# Patient Record
Sex: Female | Born: 1980 | Race: Black or African American | Hispanic: No | Marital: Single | Smoking: Never smoker
Health system: Southern US, Community
[De-identification: ages and names within clinical notes are randomized; demographics above are authoritative.]

## PROBLEM LIST (undated history)

## (undated) DIAGNOSIS — I10 Essential (primary) hypertension: Secondary | ICD-10-CM

## (undated) HISTORY — PX: TONSILLECTOMY: SUR1361

## (undated) HISTORY — PX: WISDOM TOOTH EXTRACTION: SHX21

---

## 2012-07-27 ENCOUNTER — Emergency Department (HOSPITAL_BASED_OUTPATIENT_CLINIC_OR_DEPARTMENT_OTHER): Payer: No Typology Code available for payment source

## 2012-07-27 ENCOUNTER — Emergency Department (HOSPITAL_BASED_OUTPATIENT_CLINIC_OR_DEPARTMENT_OTHER)
Admission: EM | Admit: 2012-07-27 | Discharge: 2012-07-27 | Disposition: A | Discharge status: Home / Self Care | Payer: No Typology Code available for payment source | Attending: Emergency Medicine | Admitting: Emergency Medicine

## 2012-07-27 ENCOUNTER — Encounter (HOSPITAL_BASED_OUTPATIENT_CLINIC_OR_DEPARTMENT_OTHER): Payer: Self-pay | Admitting: *Deleted

## 2012-07-27 DIAGNOSIS — S20219A Contusion of unspecified front wall of thorax, initial encounter: Secondary | ICD-10-CM | POA: Insufficient documentation

## 2012-07-27 DIAGNOSIS — S93401A Sprain of unspecified ligament of right ankle, initial encounter: Secondary | ICD-10-CM

## 2012-07-27 DIAGNOSIS — S60229A Contusion of unspecified hand, initial encounter: Secondary | ICD-10-CM | POA: Insufficient documentation

## 2012-07-27 DIAGNOSIS — S93409A Sprain of unspecified ligament of unspecified ankle, initial encounter: Secondary | ICD-10-CM | POA: Insufficient documentation

## 2012-07-27 DIAGNOSIS — S60222A Contusion of left hand, initial encounter: Secondary | ICD-10-CM

## 2012-07-27 DIAGNOSIS — Y92410 Unspecified street and highway as the place of occurrence of the external cause: Secondary | ICD-10-CM | POA: Insufficient documentation

## 2012-07-27 LAB — URINALYSIS, ROUTINE W REFLEX MICROSCOPIC
Glucose, UA: NEGATIVE mg/dL
Protein, ur: NEGATIVE mg/dL
pH: 6.5 (ref 5.0–8.0)

## 2012-07-27 LAB — URINE MICROSCOPIC-ADD ON

## 2012-07-27 MED ORDER — IBUPROFEN 800 MG PO TABS: 800.0000 mg | ORAL_TABLET | Freq: Three times a day (TID) | ORAL | Status: AC
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
## 2012-07-28 NOTE — ED Provider Notes (Signed)
Medical screening examination/treatment/procedure(s) were performed by non-physician practitioner and as supervising physician I was immediately available for consultation/collaboration.   Rush Salce W. Fredi Geiler, MD 07/28/12 1251 

## 2013-07-20 ENCOUNTER — Encounter (HOSPITAL_BASED_OUTPATIENT_CLINIC_OR_DEPARTMENT_OTHER): Payer: Self-pay | Admitting: *Deleted

## 2013-07-20 ENCOUNTER — Emergency Department (HOSPITAL_BASED_OUTPATIENT_CLINIC_OR_DEPARTMENT_OTHER)
Admission: EM | Admit: 2013-07-20 | Discharge: 2013-07-20 | Disposition: A | Discharge status: Home / Self Care | Payer: Medicaid Other | Attending: Emergency Medicine | Admitting: Emergency Medicine

## 2013-07-20 DIAGNOSIS — L299 Pruritus, unspecified: Secondary | ICD-10-CM | POA: Insufficient documentation

## 2013-07-20 DIAGNOSIS — R209 Unspecified disturbances of skin sensation: Secondary | ICD-10-CM | POA: Insufficient documentation

## 2013-07-20 DIAGNOSIS — T7840XA Allergy, unspecified, initial encounter: Secondary | ICD-10-CM

## 2013-07-20 DIAGNOSIS — R21 Rash and other nonspecific skin eruption: Secondary | ICD-10-CM | POA: Insufficient documentation

## 2013-07-20 MED ORDER — PREDNISONE 10 MG PO TABS: 20.0000 mg | ORAL_TABLET | Freq: Two times a day (BID) | ORAL | Status: DC

## 2013-07-20 NOTE — ED Notes (Addendum)
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 States her hands broke out on Wednesday a day after using the chemicals and both wrist swelling since Friday. States hands bilateral feel tight. Redness noted above hands bilateral. No other complaints. Little relief with benadryl and ibuprofen

## 2013-07-20 NOTE — ED Provider Notes (Signed)
CSN: [id number]     Arrival date & time 07/20/13  2105 History  This chart was scribed for Geoffery Lyons, MD by Ardelia Mems, ED Scribe. This patient was seen in room MH02/MH02 and the patient's care was started at 11:03 PM.   Chief Complaint  Patient presents with  . ? allergic reaction     The history is provided by the patient. No language interpreter was used.    HPI Comments: Jody Kennedy is a 32 y.o. 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 She suspects that she is allergic to this chemical. She also states that she has been wearing latex gloves at this job, but states that she has does not believe she has a latex allergy. She states that she has been taking Bendaryl which only offers temporary relief. She reports that she has tching and "aching, burning" pain to the area of the rash. She also complains of mild, intermittent numbness to her fingers. She states that she has full ROM of both hands and all fingers. She states that she is otherwise healthy with no chronic medical conditions. She denies trouble swallowing, SOB, wheezing, fever or any other symptoms. She denies any history of smoking and denies alcohol use.  History reviewed. No pertinent past medical history.  Past Surgical History  Procedure Laterality Date  . Wisdom tooth extraction    . Tonsillectomy     No family history on file. History  Substance Use Topics  . Smoking status: Never Smoker   . Smokeless tobacco: Not on file  . Alcohol Use: No   OB History   Grav Para Term Preterm Abortions TAB SAB Ect Mult Living                 Review of Systems A complete 10 system review of systems was obtained and all systems are negative except as noted in the HPI and PMH.   Allergies  Shrimp  Home Medications  No current outpatient  prescriptions on file.  Triage Vitals: BP 163/96  Pulse 73  Temp(Src) 99.4 F (37.4 C) (Oral)  Resp 20  Ht 4' 11.5" (1.511 m)  Wt 220 lb (99.791 kg)  BMI 43.71 kg/m2  SpO2 99%  LMP 07/04/2013  Physical Exam  Nursing note and vitals reviewed. Constitutional: She is oriented to person, place, and time. She appears well-developed and well-nourished.  HENT:  Head: Normocephalic and atraumatic.  Eyes: Conjunctivae and EOM are normal.  Neck: Normal range of motion. Neck supple. No tracheal deviation present.  Cardiovascular: Normal rate.   Pulmonary/Chest: Effort normal. No respiratory distress.  Abdominal: Soft. She exhibits no distension.  Musculoskeletal: Normal range of motion. She exhibits no tenderness.  Neurological: She is alert and oriented to person, place, and time.  Skin: Skin is warm and dry. No rash noted.  The wrists and hands are noted to have mild swelling, with areas of excoriation. Capillary refill is brisk.   Psychiatric: She has a normal mood and affect.    ED Course   Procedures (including critical care time)  DIAGNOSTIC STUDIES: Oxygen Saturation is 99% on RA, normal by my interpretation.    COORDINATION OF CARE: 11:07 PM- Pt advised of plan for discharge with a prescription for prednisone and pt agrees.   Labs Reviewed - No data  to display  No results found.  1. Allergic reaction, initial encounter     MDM  This appears to be a reaction to either chemical at work or possibly to latex from the gloves that she wears at work. She will be treated with prednisone and advised to wear nonlatex gloves and see if this makes a difference. There is no evidence for anaphylaxis and no indication for further testing or workup. She will be discharged to home.       I personally performed the services described in this documentation, which was scribed in my presence. The recorded information has been reviewed and is accurate.       Geoffery Lyons,  MD 07/21/13 [phone]

## 2013-07-20 NOTE — ED Notes (Signed)
MD at bedside. 

## 2013-12-27 ENCOUNTER — Emergency Department (HOSPITAL_BASED_OUTPATIENT_CLINIC_OR_DEPARTMENT_OTHER)
Admission: EM | Admit: 2013-12-27 | Discharge: 2013-12-27 | Disposition: A | Discharge status: Home / Self Care | Payer: Medicaid Other | Attending: Emergency Medicine | Admitting: Emergency Medicine

## 2013-12-27 ENCOUNTER — Emergency Department (HOSPITAL_BASED_OUTPATIENT_CLINIC_OR_DEPARTMENT_OTHER): Payer: Medicaid Other

## 2013-12-27 ENCOUNTER — Encounter (HOSPITAL_BASED_OUTPATIENT_CLINIC_OR_DEPARTMENT_OTHER): Payer: Self-pay | Admitting: Emergency Medicine

## 2013-12-27 DIAGNOSIS — J329 Chronic sinusitis, unspecified: Secondary | ICD-10-CM

## 2013-12-27 DIAGNOSIS — J40 Bronchitis, not specified as acute or chronic: Secondary | ICD-10-CM

## 2013-12-27 DIAGNOSIS — R1110 Vomiting, unspecified: Secondary | ICD-10-CM | POA: Insufficient documentation

## 2013-12-27 DIAGNOSIS — J209 Acute bronchitis, unspecified: Secondary | ICD-10-CM | POA: Insufficient documentation

## 2013-12-27 MED ORDER — AMOXICILLIN 500 MG PO CAPS: 500.0000 mg | ORAL_CAPSULE | Freq: Three times a day (TID) | ORAL | Status: DC

## 2013-12-27 MED ORDER — PHENYLEPH-PROMETHAZINE-COD 5-6.25-10 MG/5ML PO SYRP: 5.0000 mL | ORAL_SOLUTION | Freq: Four times a day (QID) | ORAL | Status: DC | PRN

## 2013-12-27 NOTE — ED Notes (Signed)
Reports URI symptoms for three weeks.  Taking OTC sinus medication without relief.  C/o productive cough, vomiting after coughing.  Denies fever.

## 2013-12-27 NOTE — Discharge Instructions (Signed)
Your chest x=-ray shows no pneumonia. Be sure you are drinking plenty of fluids, use a cool mist vaporizer, rest, take the medications as directed and follow up with your doctor. Do not take the narcotic cough medication if you are driving as it will make you sleepy. Return here as needed.

## 2013-12-27 NOTE — ED Provider Notes (Signed)
CSN: [id number]     Arrival date & time 12/27/13  1825 History   First MD Initiated Contact with Patient 12/27/13 1958     Chief Complaint  Patient presents with  . Cough   (Consider location/radiation/quality/duration/timing/severity/associated sxs/prior Treatment) Patient is a 33 y.o. female presenting with cough. The history is provided by the patient.  Cough Cough characteristics:  Productive Sputum characteristics:  Yellow and green Severity:  Moderate Onset quality:  Gradual Duration:  3 weeks Timing:  Constant Progression:  Worsening Chronicity:  New Smoker: no   Relieved by:  Nothing Worsened by:  Lying down Ineffective treatments:  Decongestant Associated symptoms: chills, fever, myalgias, rhinorrhea, sinus congestion and sore throat   Associated symptoms: no chest pain, no ear pain, no eye discharge, no headaches, no rash and no wheezing    Jody Kennedy is a 33 y.o. female who presents to the ED with cough and congestion for the past 3 weeks. She has taken OTC medications without relief. Complains of facial pain around her eyes.   History reviewed. No pertinent past medical history. Past Surgical History  Procedure Laterality Date  . Wisdom tooth extraction    . Tonsillectomy     No family history on file. History  Substance Use Topics  . Smoking status: Never Smoker   . Smokeless tobacco: Not on file  . Alcohol Use: No   OB History   Grav Para Term Preterm Abortions TAB SAB Ect Mult Living                 Review of Systems  Constitutional: Positive for fever and chills.  HENT: Positive for rhinorrhea and sore throat. Negative for ear pain.   Eyes: Negative for discharge and redness.  Respiratory: Positive for cough. Negative for wheezing.   Cardiovascular: Negative for chest pain.  Gastrointestinal: Positive for vomiting (with cough). Negative for nausea and abdominal pain.  Genitourinary: Negative for dysuria, urgency and frequency.  Musculoskeletal:  Positive for myalgias.  Skin: Negative for rash.  Neurological: Negative for syncope and headaches.  Psychiatric/Behavioral: Negative for confusion. The patient is not nervous/anxious.     Allergies  Shrimp  Home Medications   Current Outpatient Rx  Name  Route  Sig  Dispense  Refill  . predniSONE (DELTASONE) 10 MG tablet   Oral   Take 2 tablets (20 mg total) by mouth 2 (two) times daily.   12 tablet   0    BP 126/79  Pulse 89  Temp(Src) 99 F (37.2 C)  Resp 16  Ht 4\' 11"  (1.499 m)  Wt 252 lb (114.306 kg)  BMI 50.87 kg/m2  SpO2 100%  LMP 12/23/2013 Physical Exam  Nursing note and vitals reviewed. Constitutional: She is oriented to person, place, and time. She appears well-developed and well-nourished.  HENT:  Head: Normocephalic and atraumatic.  Right Ear: Tympanic membrane normal.  Left Ear: Tympanic membrane normal.  Nose: Right sinus exhibits maxillary sinus tenderness and frontal sinus tenderness. Left sinus exhibits maxillary sinus tenderness and frontal sinus tenderness.  Eyes: Conjunctivae and EOM are normal. Pupils are equal, round, and reactive to light.  Neck: Neck supple.  Cardiovascular: Normal rate and regular rhythm.   Pulmonary/Chest: Effort normal. She has no wheezes. She has no rales.  Abdominal: Soft. There is no tenderness.  Musculoskeletal: Normal range of motion.  Neurological: She is alert and oriented to person, place, and time. No cranial nerve deficit.  Skin: Skin is warm and dry.  Psychiatric: She has a  normal mood and affect. Her behavior is normal.    ED Course  Procedures (including critical care time) Labs Review Labs Reviewed - No data to display Imaging Review Dg Chest 2 View  12/27/2013   CLINICAL DATA:  Cough.  EXAM: CHEST  2 VIEW  COMPARISON:  None.  FINDINGS: Mediastinum and hilar structures normal. Lungs are clear. Cardiomegaly, no CHF. No acute bony abnormality.  IMPRESSION: Cardiomegaly, no CHF.  No acute cardiopulmonary  disease.   Electronically Signed   By: Maisie Fus  Register   On: 12/27/2013 19:22    MDM  33 y.o. female with cough, fever, myalgias and sinus pain x 3 weeks. I have reviewed this patient's vital signs, nurses notes, appropriate labs and imaging.  I have discussed findings with the patient and plan of care. She voices understanding. Stable for discharge without any immediate complications. Will treat sinus infection and cough.    Medication List    TAKE these medications       amoxicillin 500 MG capsule  Commonly known as:  AMOXIL  Take 1 capsule (500 mg total) by mouth 3 (three) times daily.     Phenyleph-Promethazine-Cod 5-6.25-10 MG/5ML Syrp  Take 5 mLs by mouth every 6 (six) hours as needed.      ASK your doctor about these medications       predniSONE 10 MG tablet  Commonly known as:  DELTASONE  Take 2 tablets (20 mg total) by mouth 2 (two) times daily.           [address] [place], [place] 12/28/13 [phone]

## 2013-12-28 NOTE — ED Provider Notes (Signed)
Medical screening examination/treatment/procedure(s) were performed by non-physician practitioner and as supervising physician I was immediately available for consultation/collaboration.  EKG Interpretation   None         Shelda JakesScott W. Laikynn Pollio, MD 12/28/13 1950

## 2016-11-08 ENCOUNTER — Emergency Department (HOSPITAL_BASED_OUTPATIENT_CLINIC_OR_DEPARTMENT_OTHER)
Admission: EM | Admit: 2016-11-08 | Discharge: 2016-11-09 | Disposition: A | Discharge status: Home / Self Care | Payer: Medicaid Other | Attending: Emergency Medicine | Admitting: Emergency Medicine

## 2016-11-08 ENCOUNTER — Emergency Department (HOSPITAL_BASED_OUTPATIENT_CLINIC_OR_DEPARTMENT_OTHER): Payer: Medicaid Other

## 2016-11-08 ENCOUNTER — Encounter (HOSPITAL_BASED_OUTPATIENT_CLINIC_OR_DEPARTMENT_OTHER): Payer: Self-pay | Admitting: Emergency Medicine

## 2016-11-08 DIAGNOSIS — M545 Low back pain, unspecified: Secondary | ICD-10-CM

## 2016-11-08 DIAGNOSIS — R110 Nausea: Secondary | ICD-10-CM | POA: Insufficient documentation

## 2016-11-08 DIAGNOSIS — N898 Other specified noninflammatory disorders of vagina: Secondary | ICD-10-CM | POA: Diagnosis not present

## 2016-11-08 DIAGNOSIS — R1030 Lower abdominal pain, unspecified: Secondary | ICD-10-CM | POA: Diagnosis not present

## 2016-11-08 LAB — URINALYSIS, MICROSCOPIC (REFLEX)

## 2016-11-08 LAB — WET PREP, GENITAL
SPERM: NONE SEEN
TRICH WET PREP: NONE SEEN
YEAST WET PREP: NONE SEEN

## 2016-11-08 LAB — CBC WITH DIFFERENTIAL/PLATELET
BASOS ABS: 0 10*3/uL (ref 0.0–0.1)
Basophils Relative: 0 %
EOS PCT: 5 %
Eosinophils Absolute: 0.5 10*3/uL (ref 0.0–0.7)
HCT: 39.7 % (ref 36.0–46.0)
Hemoglobin: 12.8 g/dL (ref 12.0–15.0)
LYMPHS PCT: 27 %
Lymphs Abs: 2.3 10*3/uL (ref 0.7–4.0)
MCH: 25.2 pg — AB (ref 26.0–34.0)
MCHC: 32.2 g/dL (ref 30.0–36.0)
MCV: 78.1 fL (ref 78.0–100.0)
MONO ABS: 0.5 10*3/uL (ref 0.1–1.0)
MONOS PCT: 6 %
Neutro Abs: 5.2 10*3/uL (ref 1.7–7.7)
Neutrophils Relative %: 62 %
PLATELETS: 370 10*3/uL (ref 150–400)
RBC: 5.08 MIL/uL (ref 3.87–5.11)
RDW: 14.7 % (ref 11.5–15.5)
WBC: 8.5 10*3/uL (ref 4.0–10.5)

## 2016-11-08 LAB — PREGNANCY, URINE: PREG TEST UR: NEGATIVE

## 2016-11-08 LAB — COMPREHENSIVE METABOLIC PANEL
ALT: 16 U/L (ref 14–54)
ANION GAP: 10 (ref 5–15)
AST: 22 U/L (ref 15–41)
Albumin: 3.6 g/dL (ref 3.5–5.0)
Alkaline Phosphatase: 58 U/L (ref 38–126)
BUN: 7 mg/dL (ref 6–20)
CHLORIDE: 103 mmol/L (ref 101–111)
CO2: 23 mmol/L (ref 22–32)
Calcium: 9 mg/dL (ref 8.9–10.3)
Creatinine, Ser: 0.63 mg/dL (ref 0.44–1.00)
Glucose, Bld: 116 mg/dL — ABNORMAL HIGH (ref 65–99)
POTASSIUM: 3.5 mmol/L (ref 3.5–5.1)
Sodium: 136 mmol/L (ref 135–145)
TOTAL PROTEIN: 8.3 g/dL — AB (ref 6.5–8.1)
Total Bilirubin: 0.3 mg/dL (ref 0.3–1.2)

## 2016-11-08 LAB — LIPASE, BLOOD: LIPASE: 24 U/L (ref 11–51)

## 2016-11-08 LAB — URINALYSIS, ROUTINE W REFLEX MICROSCOPIC
Bilirubin Urine: NEGATIVE
Glucose, UA: NEGATIVE mg/dL
KETONES UR: NEGATIVE mg/dL
LEUKOCYTES UA: NEGATIVE
NITRITE: NEGATIVE
PROTEIN: 30 mg/dL — AB
Specific Gravity, Urine: 1.024 (ref 1.005–1.030)
pH: 6 (ref 5.0–8.0)

## 2016-11-08 MED ORDER — SODIUM CHLORIDE 0.9 % IV BOLUS (SEPSIS)
2000.0000 mL | Freq: Once | INTRAVENOUS | Status: AC
  Administered 2016-11-08: 2000 mL via INTRAVENOUS

## 2016-11-08 MED ORDER — IOPAMIDOL (ISOVUE-300) INJECTION 61%
100.0000 mL | Freq: Once | INTRAVENOUS | Status: AC | PRN
  Administered 2016-11-08: 100 mL via INTRAVENOUS

## 2016-11-08 NOTE — ED Triage Notes (Signed)
Pt states she has had back pain x 4 days, seen at urgent care got blood work and urine done and all results were negative.

## 2016-11-08 NOTE — ED Provider Notes (Signed)
[facility]-EMERGENCY DEPT Provider Note   CSN: [id number] Arrival date & time: 11/08/16  1815  By signing my name below, I, [name], attest that this documentation has been prepared under the direction and in the presence of Loren Raceravid Vihan Santagata, MD  Electronically Signed: [name], ED Scribe. 11/08/16. 7:17 PM.   History   Chief Complaint Chief Complaint  Patient presents with  . Back Pain   The history is provided by the patient. No language interpreter was used.   HPI Comments:  [name] is a 35 y.o. female who presents to the Emergency Department complaining of persistent, intermittent lower back pain. Her back pain is worse with deep breathing. Pt also endorses abdominal cramping, nausea, Subjective fevers, chills, slight change in appetite, and minimal white vaginal discharge (since 11/03/16). Pt visited Urgent Care 2 days ago where she had blood work, urinalysis and was prescribed ciprofloxacin which she had taken with no relief x 3 days. She has used icy hot with minimal relief. Pt denies vaginal bleeding, bowel/bladder incontinence, bilateral lower extremity pain, any other associated symptoms and modifying factors at this time. Pt is followed by an OB/GYN and has a appointment tomorrow.   History reviewed. No pertinent past medical history.  There are no active problems to display for this patient.   Past Surgical History:  Procedure Laterality Date  . TONSILLECTOMY    . WISDOM TOOTH EXTRACTION      OB History    No data available       Home Medications    Prior to Admission medications   Medication Sig Start Date End Date Taking? Authorizing Provider  cyclobenzaprine (FLEXERIL) 10 MG tablet Take 1 tablet (10 mg total) by mouth 3 (three) times daily as needed for muscle spasms. 11/09/16   Gilda Creasehristopher J Pollina, MD  ibuprofen (ADVIL,MOTRIN) 800 MG tablet Take 1 tablet (800 mg total) by mouth 3 (three) times daily. 11/09/16   Gilda Creasehristopher J Pollina, MD  traMADol  (ULTRAM) 50 MG tablet Take 1 tablet (50 mg total) by mouth every 6 (six) hours as needed. 11/09/16   Gilda Creasehristopher J Pollina, MD    Family History History reviewed. No pertinent family history.  Social History Social History  Substance Use Topics  . Smoking status: Never Smoker  . Smokeless tobacco: Never Used  . Alcohol use No     Allergies   Shrimp [shellfish allergy]   Review of Systems Review of Systems  Constitutional: Positive for chills and fever.  Respiratory: Negative for shortness of breath.   Cardiovascular: Negative for chest pain.  Gastrointestinal: Positive for abdominal pain and nausea. Negative for constipation, diarrhea and vomiting.  Genitourinary: Positive for vaginal discharge. Negative for difficulty urinating, dysuria, flank pain, hematuria and pelvic pain.  Musculoskeletal: Positive for back pain and myalgias. Negative for neck pain and neck stiffness.  Skin: Negative for rash and wound.  Neurological: Negative for dizziness, weakness, light-headedness and numbness.  All other systems reviewed and are negative.    Physical Exam Updated Vital Signs BP 145/100 (BP Location: Right Arm)   Pulse 102   Temp 98.9 F (37.2 C)   Resp 18   Ht 4' 11.5" (1.511 m)   Wt 231 lb (104.8 kg)   LMP 10/29/2016   SpO2 100%   BMI 45.88 kg/m   Physical Exam  Constitutional: She is oriented to person, place, and time. She appears well-developed and well-nourished.  HENT:  Head: Normocephalic and atraumatic.  Mouth/Throat: Oropharynx is clear and moist.  Eyes:  EOM are normal. Pupils are equal, round, and reactive to light.  Neck: Normal range of motion. Neck supple.  Cardiovascular: Normal rate and regular rhythm.   Pulmonary/Chest: Effort normal and breath sounds normal.  Abdominal: Soft. Bowel sounds are normal. There is no tenderness. There is no rebound and no guarding.  Musculoskeletal: Normal range of motion. She exhibits tenderness. She exhibits no edema.    No CVA tenderness. Patient does have bilateral anterior lumbar paraspinal tenderness to palpation. Negative straight leg raise bilaterally. No lower extremity swelling, asymmetry or tenderness. 2+ distal pulses in all extremities.  Neurological: She is alert and oriented to person, place, and time.  5/5 motor in all extremities. Sensation is fully intact. No saddle anesthesia.  Skin: Skin is warm and dry. Capillary refill takes less than 2 seconds. No rash noted. No erythema.  Psychiatric: She has a normal mood and affect. Her behavior is normal.  Nursing note and vitals reviewed.    ED Treatments / Results  DIAGNOSTIC STUDIES:  Oxygen Saturation is 96% on RA, normal by my interpretation.    COORDINATION OF CARE:  7:10 PM Discussed treatment plan with pt at bedside and pt agreed to plan.  Labs (all labs ordered are listed, but only abnormal results are displayed) Labs Reviewed  WET PREP, GENITAL - Abnormal; Notable for the following:       Result Value   Clue Cells Wet Prep HPF POC PRESENT (*)    WBC, Wet Prep HPF POC MODERATE (*)    All other components within normal limits  CBC WITH DIFFERENTIAL/PLATELET - Abnormal; Notable for the following:    MCH 25.2 (*)    All other components within normal limits  COMPREHENSIVE METABOLIC PANEL - Abnormal; Notable for the following:    Glucose, Bld 116 (*)    Total Protein 8.3 (*)    All other components within normal limits  URINALYSIS, ROUTINE W REFLEX MICROSCOPIC - Abnormal; Notable for the following:    Hgb urine dipstick SMALL (*)    Protein, ur 30 (*)    All other components within normal limits  URINALYSIS, MICROSCOPIC (REFLEX) - Abnormal; Notable for the following:    Bacteria, UA RARE (*)    Squamous Epithelial / LPF 0-5 (*)    All other components within normal limits  LIPASE, BLOOD  PREGNANCY, URINE  GC/CHLAMYDIA PROBE AMP ([facility]    EKG  EKG Interpretation None       Radiology No results  found.  Procedures Procedures (including critical care time)  Medications Ordered in ED Medications  sodium chloride 0.9 % bolus 2,000 mL (0 mLs Intravenous Stopped 11/09/16 0111)  iopamidol (ISOVUE-300) 61 % injection 100 mL (100 mLs Intravenous Contrast Given 11/08/16 2349)     Initial Impression / Assessment and Plan / ED Course  I have reviewed the triage vital signs and the nursing notes.  Pertinent labs & imaging results that were available during my care of the patient were reviewed by me and considered in my medical decision making (see chart for details).  Clinical Course    Patient with lower abdominal and lower back pain. Significantly tachycardic on presentation. IV fluids and pelvic ultrasound. No acute findings. Given ongoing tachycardia will get CT abdomen and pelvis. She appears in no distress. Discuss with patient and will sign out to oncoming emergency physician.   Final Clinical Impressions(s) / ED Diagnoses   Final diagnoses:  Acute bilateral low back pain without sciatica  New Prescriptions Discharge Medication List as of 11/09/2016 12:52 AM    START taking these medications   Details  cyclobenzaprine (FLEXERIL) 10 MG tablet Take 1 tablet (10 mg total) by mouth 3 (three) times daily as needed for muscle spasms., Starting Thu 11/09/2016, Print    ibuprofen (ADVIL,MOTRIN) 800 MG tablet Take 1 tablet (800 mg total) by mouth 3 (three) times daily., Starting Thu 11/09/2016, Print    traMADol (ULTRAM) 50 MG tablet Take 1 tablet (50 mg total) by mouth every 6 (six) hours as needed., Starting Thu 11/09/2016, Print      I personally performed the services described in this documentation, which was scribed in my presence. The recorded information has been reviewed and is accurate.       Loren Racer, MD 11/11/16 [phone]

## 2016-11-09 MED ORDER — KETOROLAC TROMETHAMINE 30 MG/ML IJ SOLN: 30.0000 mg | Freq: Once | INTRAMUSCULAR | Status: DC

## 2016-11-09 MED ORDER — TRAMADOL HCL 50 MG PO TABS: 50.0000 mg | ORAL_TABLET | Freq: Four times a day (QID) | ORAL | 0 refills | Status: DC | PRN

## 2016-11-09 MED ORDER — CYCLOBENZAPRINE HCL 10 MG PO TABS: 10.0000 mg | ORAL_TABLET | Freq: Three times a day (TID) | ORAL | 0 refills | Status: DC | PRN

## 2016-11-09 MED ORDER — IBUPROFEN 800 MG PO TABS: 800.0000 mg | ORAL_TABLET | Freq: Three times a day (TID) | ORAL | 0 refills | Status: DC

## 2016-11-09 NOTE — ED Provider Notes (Signed)
Patient signed out to me to follow-up on CT scan. Patient seen with low back pain and urinary symptoms. Urinalysis unremarkable. Patient had pelvic ultrasound and pelvic exam that did not explain the patient's symptoms. As she was tachycardic, likely secondary to her pain, CT scan was performed to further evaluate. CT scan shows no acute pathology. Patient will be treated with analgesia and follow-up as an outpatient with her OB/GYN and PCP.   Gilda Creasehristopher J Zaine Elsass, MD 11/09/16 0040

## 2016-11-09 NOTE — ED Provider Notes (Signed)
Patient signed out to me to follow-up CT scan. Patient had been seen with lower abdominal and low back pain. Ultrasound, lab work has been unremarkable. She had clue cells on wet prep, no other abnormalities noted. She is not symptomatic, will defer treatment. She has follow-up with her OB/GYN doctor tomorrow.  CT scan has been performed. No acute pathology is noted. Patient will be treated for musculoskeletal pain, follow-up with her primary doctor.   Gilda Creasehristopher J Pollina, MD 11/09/16 [phone]

## 2016-11-10 LAB — GC/CHLAMYDIA PROBE AMP (~~LOC~~) NOT AT ARMC
Chlamydia: NEGATIVE
Neisseria Gonorrhea: NEGATIVE

## 2017-10-09 ENCOUNTER — Encounter (HOSPITAL_BASED_OUTPATIENT_CLINIC_OR_DEPARTMENT_OTHER): Payer: Self-pay | Admitting: *Deleted

## 2017-10-09 ENCOUNTER — Other Ambulatory Visit: Payer: Self-pay

## 2017-10-09 ENCOUNTER — Emergency Department (HOSPITAL_BASED_OUTPATIENT_CLINIC_OR_DEPARTMENT_OTHER): Payer: Self-pay

## 2017-10-09 ENCOUNTER — Emergency Department (HOSPITAL_BASED_OUTPATIENT_CLINIC_OR_DEPARTMENT_OTHER)
Admission: EM | Admit: 2017-10-09 | Discharge: 2017-10-10 | Disposition: A | Discharge status: Home / Self Care | Payer: Self-pay | Attending: Emergency Medicine | Admitting: Emergency Medicine

## 2017-10-09 DIAGNOSIS — Z79899 Other long term (current) drug therapy: Secondary | ICD-10-CM | POA: Insufficient documentation

## 2017-10-09 DIAGNOSIS — J328 Other chronic sinusitis: Secondary | ICD-10-CM | POA: Insufficient documentation

## 2017-10-09 DIAGNOSIS — R51 Headache: Secondary | ICD-10-CM | POA: Insufficient documentation

## 2017-10-09 MED ORDER — LORATADINE 10 MG PO TABS
10.0000 mg | ORAL_TABLET | Freq: Once | ORAL | Status: AC
  Administered 2017-10-09: 10 mg via ORAL
  Filled 2017-10-09: qty 1

## 2017-10-09 NOTE — ED Triage Notes (Signed)
Numbness and swelling to the right side of her face x 3 days. She has bumps under her skin on her right hand. Nasal congestion worse at day x 2 weeks.

## 2017-10-09 NOTE — ED Provider Notes (Signed)
[facility] EMERGENCY DEPARTMENT Provider Note   CSN: [id number] Arrival date & time: 10/09/17  2014     History   Chief Complaint Chief Complaint  Patient presents with  . Facial Swelling    HPI Jody Kennedy is a 36 y.o. female.  The history is provided by the patient.  Illness  This is a new problem. The current episode started more than 2 days ago (5 days ago face felt puffy and weird and she also noted a bump on her right palm). The problem occurs constantly. The problem has not changed since onset.Pertinent negatives include no chest pain, no abdominal pain, no headaches and no shortness of breath. Nothing aggravates the symptoms. Nothing relieves the symptoms. She has tried nothing for the symptoms. The treatment provided no relief.    History reviewed. No pertinent past medical history.  There are no active problems to display for this patient.   Past Surgical History:  Procedure Laterality Date  . TONSILLECTOMY    . WISDOM TOOTH EXTRACTION      OB History    No data available       Home Medications    Prior to Admission medications   Medication Sig Start Date End Date Taking? Authorizing Provider  cyclobenzaprine (FLEXERIL) 10 MG tablet Take 1 tablet (10 mg total) by mouth 3 (three) times daily as needed for muscle spasms. 11/09/16   Gilda CreasePollina, Christopher J, MD  ibuprofen (ADVIL,MOTRIN) 800 MG tablet Take 1 tablet (800 mg total) by mouth 3 (three) times daily. 11/09/16   Gilda CreasePollina, Christopher J, MD  traMADol (ULTRAM) 50 MG tablet Take 1 tablet (50 mg total) by mouth every 6 (six) hours as needed. 11/09/16   Gilda CreasePollina, Christopher J, MD    Family History No family history on file.  Social History Social History   Tobacco Use  . Smoking status: Never Smoker  . Smokeless tobacco: Never Used  Substance Use Topics  . Alcohol use: No  . Drug use: No     Allergies   Shrimp [shellfish allergy]   Review of Systems Review of Systems    Constitutional: Negative for diaphoresis, fatigue and fever.  HENT: Negative for ear discharge and voice change.   Eyes: Negative for photophobia, pain and visual disturbance.  Respiratory: Negative for shortness of breath.   Cardiovascular: Negative for chest pain.  Gastrointestinal: Negative for abdominal pain.  Neurological: Negative for dizziness, tremors, seizures, syncope, facial asymmetry, speech difficulty, weakness, light-headedness, numbness and headaches.  Psychiatric/Behavioral: Negative for confusion.  All other systems reviewed and are negative.    Physical Exam Updated Vital Signs BP (!) 161/109 (BP Location: Right Arm)   Pulse (!) 107   Temp 99 F (37.2 C) (Oral)   Resp 18   Ht 4' 11.5" (1.511 m)   Wt 104.3 kg (230 lb)   LMP 10/06/2017   SpO2 97%   BMI 45.68 kg/m   Physical Exam  Constitutional: She is oriented to person, place, and time. She appears well-developed and well-nourished. No distress.  HENT:  Head: Normocephalic and atraumatic.  Mouth/Throat: No oropharyngeal exudate.  Eyes: Conjunctivae and EOM are normal. Pupils are equal, round, and reactive to light.  No proptosis intact cognition  Neck: Normal range of motion. Neck supple.  Cardiovascular: Normal rate, regular rhythm, normal heart sounds and intact distal pulses.  Pulmonary/Chest: Effort normal and breath sounds normal. She has no wheezes.  Abdominal: Soft. Bowel sounds are normal. She exhibits no mass. There is no tenderness.  There is no rebound and no guarding.  Musculoskeletal: Normal range of motion.  Neurological: She is alert and oriented to person, place, and time. She displays normal reflexes. No cranial nerve deficit.  Skin: Skin is warm and dry. Capillary refill takes less than 2 seconds.  Psychiatric: She has a normal mood and affect.     ED Treatments / Results  Labs (all labs ordered are listed, but only abnormal results are displayed) Labs Reviewed - No data to  display  EKG  EKG Interpretation None       Radiology Ct Head Wo Contrast  Result Date: 10/09/2017 CLINICAL DATA:  Right maxillary numbness and swelling for 3 days, headache with nasal congestion EXAM: CT HEAD WITHOUT CONTRAST TECHNIQUE: Contiguous axial images were obtained from the base of the skull through the vertex without intravenous contrast. COMPARISON:  Report 06/14/2012 FINDINGS: Brain: No acute territorial infarction, hemorrhage or intracranial mass is visualized. Normal ventricle size Vascular: No hyperdense vessel or unexpected calcification. Skull: Normal. Negative for fracture or focal lesion. Sinuses/Orbits: Completely opacified right maxillary sinus with hyperdense secretion. Hyperdense secretions in the right ethmoid and frontal sinuses with mild expansion. Other: None IMPRESSION: 1. No CT evidence for acute intracranial abnormality. 2. Extensive opacification of the right maxillary, ethmoid and frontal sinuses consistent with sinusitis. Secretions are hyperdense which could be secondary to inspissated secretions, or hemorrhage. Could also consider allergic fungal sinusitis. Electronically Signed   By: Jasmine Pang M.D.   On: 10/09/2017 23:45    Procedures Procedures (including critical care time)  Medications Ordered in ED Medications  loratadine (CLARITIN) tablet 10 mg (10 mg Oral Given 10/09/17 2321)       Final Clinical Impressions(s) / ED Diagnoses  Return immediately for purulent drainage decreased visual acuity, worsening HA, shortness of breath, swelling or the lips or tongue, chest pain, dyspnea on exertion, new weakness or numbness changes in vision or speech,  Inability to tolerate liquids or food, changes in voice cough, altered mental status or any concerns. No signs of systemic illness or infection. The patient is nontoxic-appearing on exam and vital signs are within normal limits.  Follow up with ENT.    I have reviewed the triage vital signs and the  nursing notes. Pertinent labs &imaging results that were available during my care of the patient were reviewed by me and considered in my medical decision making (see chart for details).  After history, exam, and medical workup I feel the patient has been appropriately medically screened and is safe for discharge home. Pertinent diagnoses were discussed with the patient. Patient was given return precautions.   Jonte Shiller, MD 10/09/17 2354

## 2017-10-10 MED ORDER — AMOXICILLIN-POT CLAVULANATE 875-125 MG PO TABS: 1.0000 | ORAL_TABLET | Freq: Two times a day (BID) | ORAL | 0 refills | Status: DC

## 2017-10-10 MED ORDER — LORATADINE 10 MG PO TABS: 10.0000 mg | ORAL_TABLET | Freq: Every day | ORAL | 0 refills | Status: DC

## 2018-01-29 ENCOUNTER — Encounter (HOSPITAL_BASED_OUTPATIENT_CLINIC_OR_DEPARTMENT_OTHER): Payer: Self-pay | Admitting: Emergency Medicine

## 2018-01-29 ENCOUNTER — Emergency Department (HOSPITAL_BASED_OUTPATIENT_CLINIC_OR_DEPARTMENT_OTHER)
Admission: EM | Admit: 2018-01-29 | Discharge: 2018-01-29 | Disposition: A | Discharge status: Home / Self Care | Payer: Self-pay | Attending: Emergency Medicine | Admitting: Emergency Medicine

## 2018-01-29 ENCOUNTER — Other Ambulatory Visit: Payer: Self-pay

## 2018-01-29 DIAGNOSIS — Z79899 Other long term (current) drug therapy: Secondary | ICD-10-CM | POA: Insufficient documentation

## 2018-01-29 DIAGNOSIS — J069 Acute upper respiratory infection, unspecified: Secondary | ICD-10-CM | POA: Insufficient documentation

## 2018-01-29 MED ORDER — IBUPROFEN 400 MG PO TABS
600.0000 mg | ORAL_TABLET | Freq: Once | ORAL | Status: AC
  Administered 2018-01-29: 600 mg via ORAL
  Filled 2018-01-29: qty 1

## 2018-01-29 NOTE — Discharge Instructions (Signed)
Please read instructions below.  You can take tylenol or advil as needed for sore throat or headache.  Drink plenty of water.  You can continue taking mucinex for congestion. Use warm saline nasal washes as well. Follow up with your primary care provider if symptoms persist, as well as to recheck your blood pressure. Return to the ER for difficulty swallowing liquids, difficulty breathing, or new or worsening symptoms.

## 2018-01-29 NOTE — ED Triage Notes (Signed)
Patient states that she has had a nasal congestion and "Headache" to her face for the last 2 -3 days. She reports that last night the pressure was so bad that she could not sleep

## 2018-01-29 NOTE — ED Provider Notes (Signed)
[facility] EMERGENCY DEPARTMENT Provider Note   CSN: [id number] Arrival date & time: 01/29/18  1447     History   Chief Complaint Chief Complaint  Patient presents with  . Nasal Congestion    HPI Jody Kennedy is a 37 y.o. female presenting to the ED with gradual onset of nasal congestion times 2-3 days.  Patient states she has nasal congestion with associated runny nose, sinus pressure, mildly sore throat and dry cough.  She states she has been feeling as though she has had sinus headaches as well.  Has been treating her symptoms with Mucinex with moderate relief of symptoms.  Denies fever, chills, vision changes, difficulty breathing or swallowing, ear pain, or other complaints.   The history is provided by the patient.    History reviewed. No pertinent past medical history.  There are no active problems to display for this patient.   Past Surgical History:  Procedure Laterality Date  . TONSILLECTOMY    . WISDOM TOOTH EXTRACTION      OB History    No data available       Home Medications    Prior to Admission medications   Medication Sig Start Date End Date Taking? Authorizing Provider  amoxicillin-clavulanate (AUGMENTIN) 875-125 MG tablet Take 1 tablet 2 (two) times daily by mouth. One po bid x 7 days 10/10/17   Palumbo, April, MD  cyclobenzaprine (FLEXERIL) 10 MG tablet Take 1 tablet (10 mg total) by mouth 3 (three) times daily as needed for muscle spasms. 11/09/16   Gilda CreasePollina, Christopher J, MD  ibuprofen (ADVIL,MOTRIN) 800 MG tablet Take 1 tablet (800 mg total) by mouth 3 (three) times daily. 11/09/16   Gilda CreasePollina, Christopher J, MD  loratadine (CLARITIN) 10 MG tablet Take 1 tablet (10 mg total) daily by mouth. 10/10/17   Palumbo, April, MD  traMADol (ULTRAM) 50 MG tablet Take 1 tablet (50 mg total) by mouth every 6 (six) hours as needed. 11/09/16   Gilda CreasePollina, Christopher J, MD    Family History History reviewed. No pertinent family history.  Social  History Social History   Tobacco Use  . Smoking status: Never Smoker  . Smokeless tobacco: Never Used  Substance Use Topics  . Alcohol use: No  . Drug use: No     Allergies   Shrimp [shellfish allergy]   Review of Systems Review of Systems  Constitutional: Negative for chills and fever.  HENT: Positive for congestion, rhinorrhea, sinus pressure and sore throat. Negative for ear pain, trouble swallowing and voice change.   Eyes: Negative for visual disturbance.  Respiratory: Positive for cough. Negative for shortness of breath.   Neurological: Positive for headaches.  All other systems reviewed and are negative.    Physical Exam Updated Vital Signs BP (!) 164/110 (BP Location: Right Arm)   Pulse (!) 102   Temp 99 F (37.2 C) (Oral)   Resp 18   Ht 5' (1.524 m)   Wt 103 kg (227 lb)   LMP 12/13/2017   SpO2 98%   BMI 44.33 kg/m   Physical Exam  Constitutional: She appears well-developed and well-nourished. No distress.  Well-appearing, tolerating secretions.  HENT:  Head: Normocephalic and atraumatic.  Right Ear: Tympanic membrane, external ear and ear canal normal.  Left Ear: Tympanic membrane, external ear and ear canal normal.  Nose: Mucosal edema present. Right sinus exhibits maxillary sinus tenderness. Left sinus exhibits maxillary sinus tenderness.  Mouth/Throat: Uvula is midline and oropharynx is clear and moist. No trismus in  the jaw. No uvula swelling.  Eyes: Conjunctivae and EOM are normal. Pupils are equal, round, and reactive to light.  Neck: Normal range of motion. Neck supple.  Cardiovascular: Regular rhythm, normal heart sounds and intact distal pulses.  mildly tachycardic.  Pulmonary/Chest: Effort normal and breath sounds normal. No respiratory distress.  Lymphadenopathy:    She has no cervical adenopathy.  Psychiatric: She has a normal mood and affect. Her behavior is normal.  Nursing note and vitals reviewed.    ED Treatments / Results   Labs (all labs ordered are listed, but only abnormal results are displayed) Labs Reviewed - No data to display  EKG  EKG Interpretation None       Radiology No results found.  Procedures Procedures (including critical care time)  Medications Ordered in ED Medications  ibuprofen (ADVIL,MOTRIN) tablet 600 mg (600 mg Oral Given 01/29/18 1716)     Initial Impression / Assessment and Plan / ED Course  I have reviewed the triage vital signs and the nursing notes.  Pertinent labs & imaging results that were available during my care of the patient were reviewed by me and considered in my medical decision making (see chart for details).     Patient complaining of symptoms of sinusitis. Mild to moderate symptoms of clear/yellow nasal discharge/congestion and scratchy throat with cough for 2-3 days.  Patient is afebrile.  No concern for acute bacterial rhinosinusitis; likely viral in nature.  Patient discharged with symptomatic treatment.  Patient instructions given for warm saline nasal washes.  Recommendations for follow-up with primary care physician, and to follow-up to recheck blood pressure..    Discussed results, findings, treatment and follow up. Patient advised of return precautions. Patient verbalized understanding and agreed with plan.  Final Clinical Impressions(s) / ED Diagnoses   Final diagnoses:  Viral URI    ED Discharge Orders    None       Jaidev Sanger, [place] N, PA-C 01/29/18 1742    Little, Ambrose [place], MD 01/30/18 [phone]

## 2018-05-21 IMAGING — US US TRANSVAGINAL NON-OB
1 series · 14 of 25 positions shown · non-contrast
Comparison: 12/03/2006

CLINICAL DATA: Low back pain for 4 days. Midline pelvic pain.
Nausea and vomiting.



[Series 1: us transvaginal non-ob · 0.22mm/px · 14 of 83 slices shown]
[im 1/83]
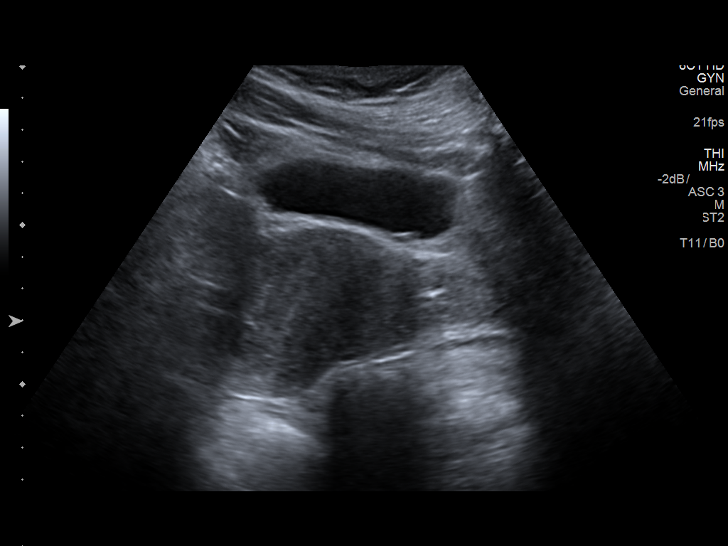
[im 7/83]
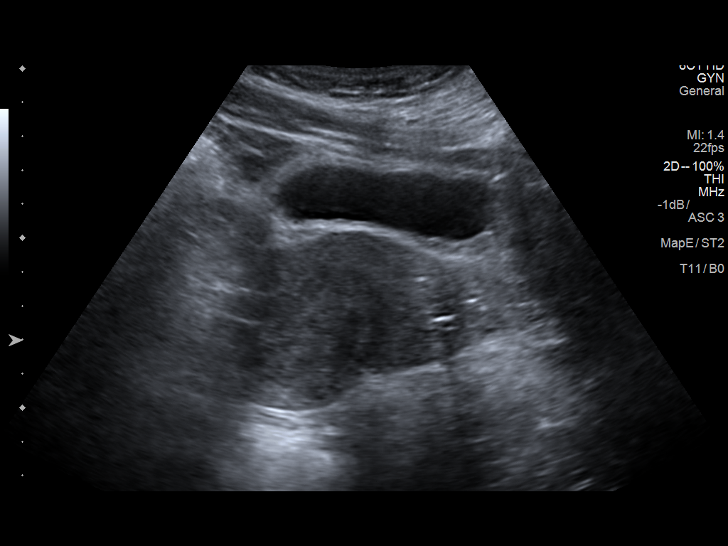
[im 14/83]
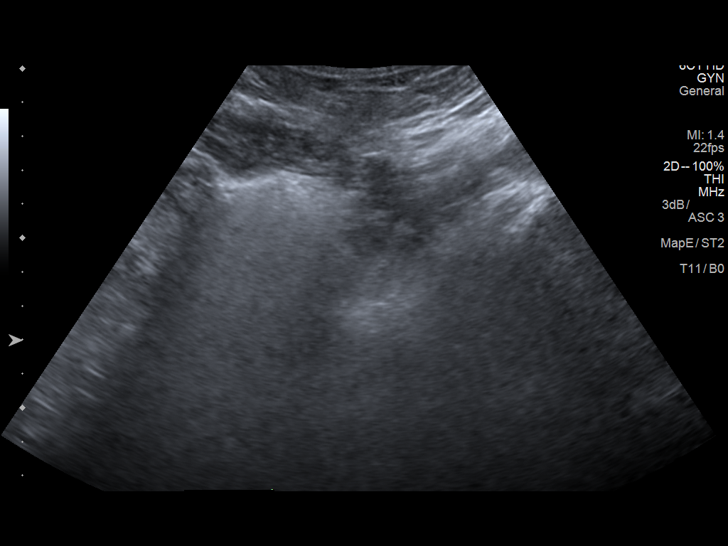
[im 21/83]
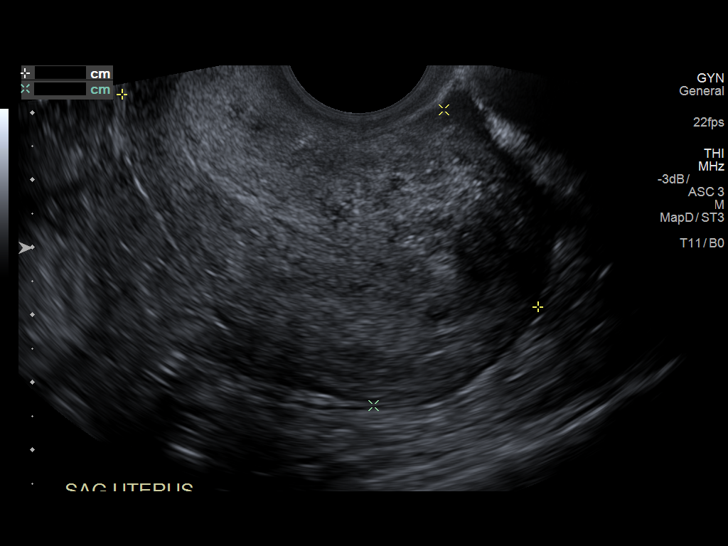
[im 28/83]
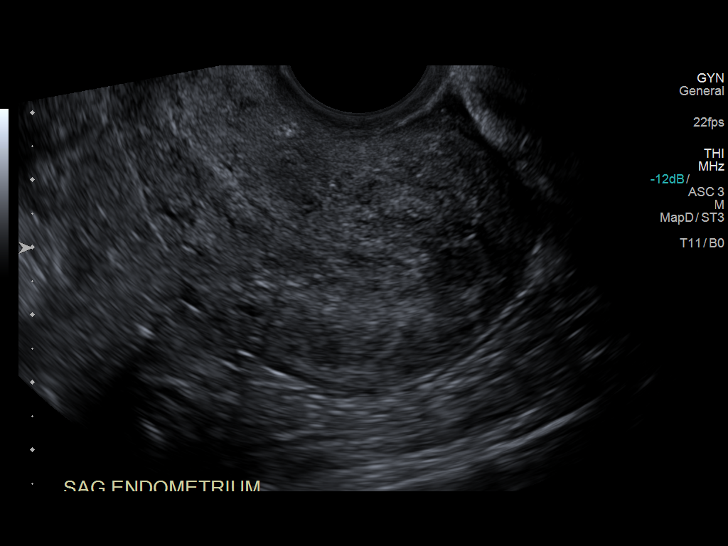
[im 31/83]
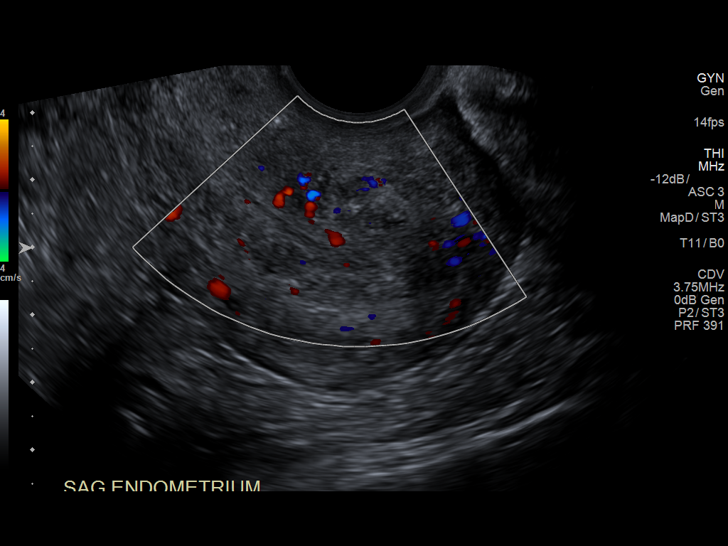
[im 38/83]
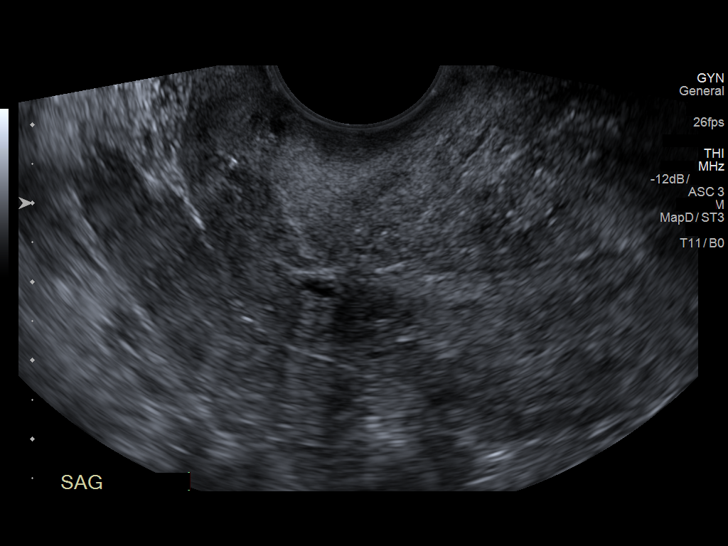
[im 45/83]
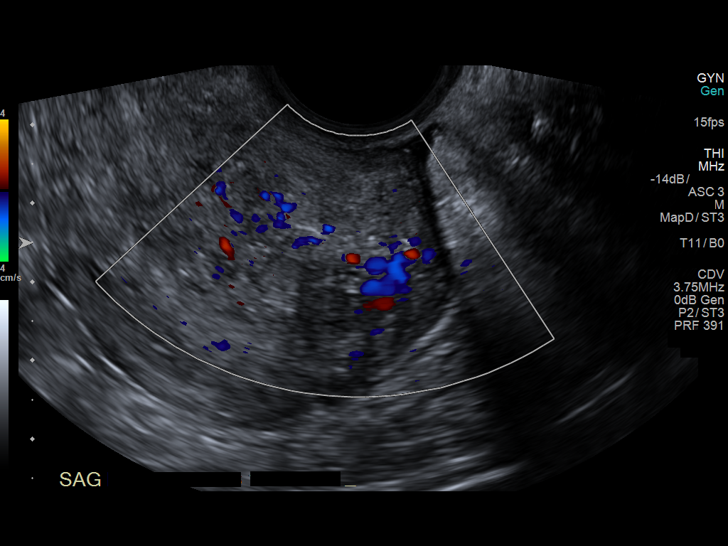
[im 52/83]
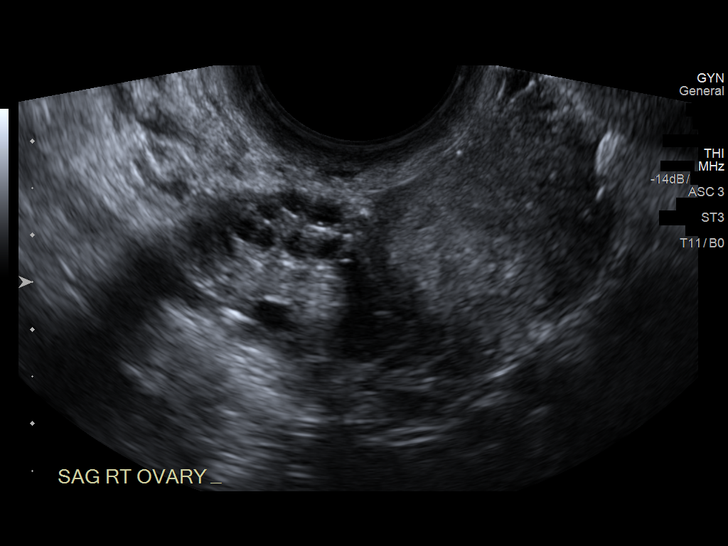
[im 55/83]
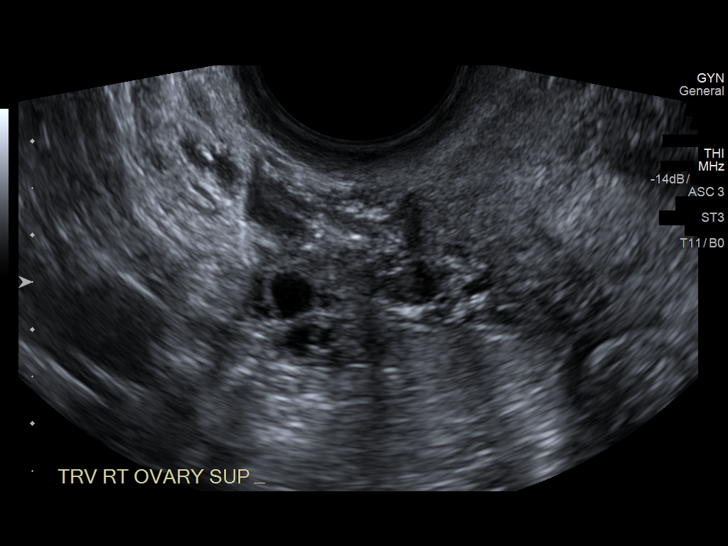
[im 62/83]
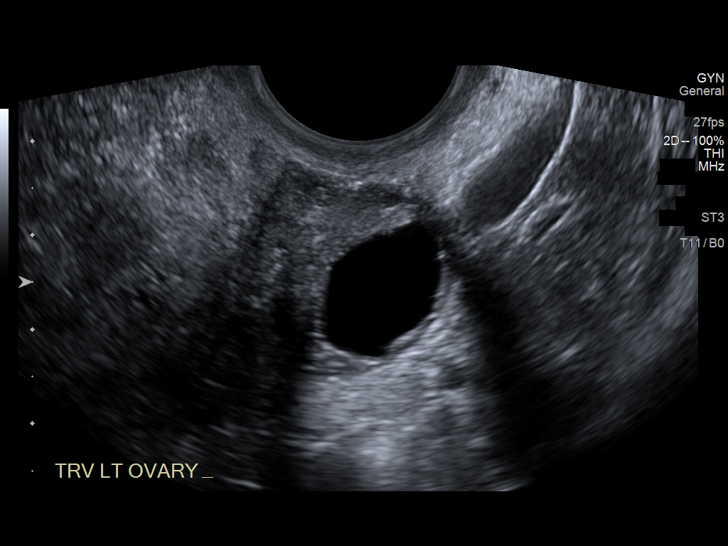
[im 69/83]
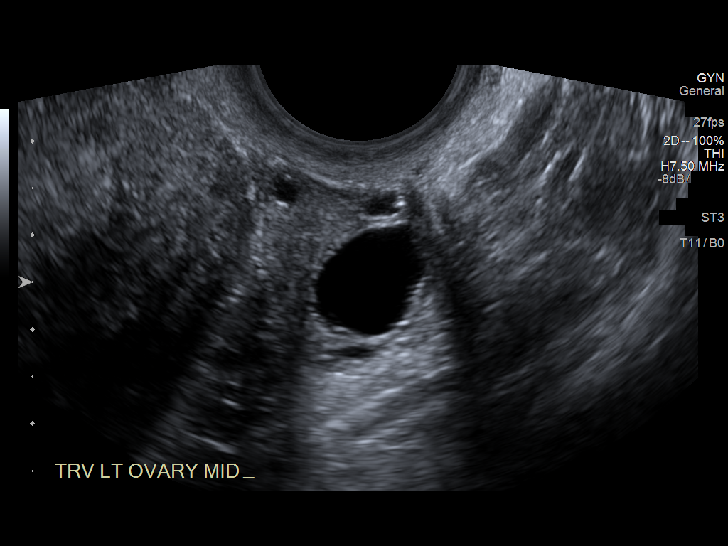
[im 76/83]
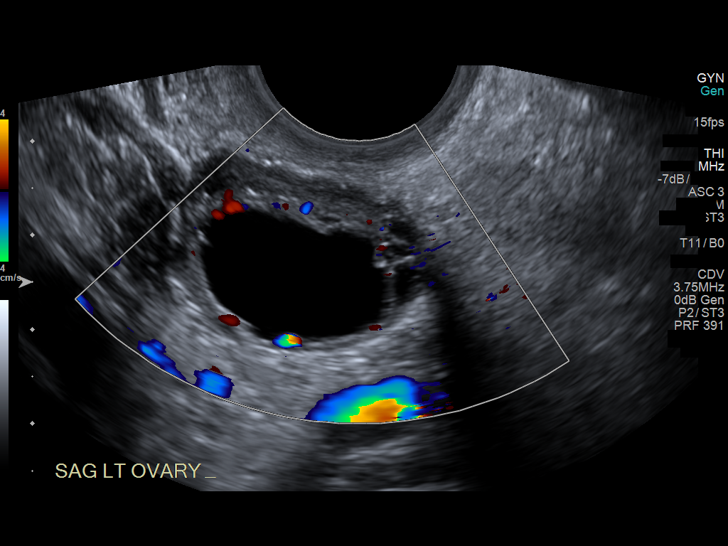
[im 83/83]
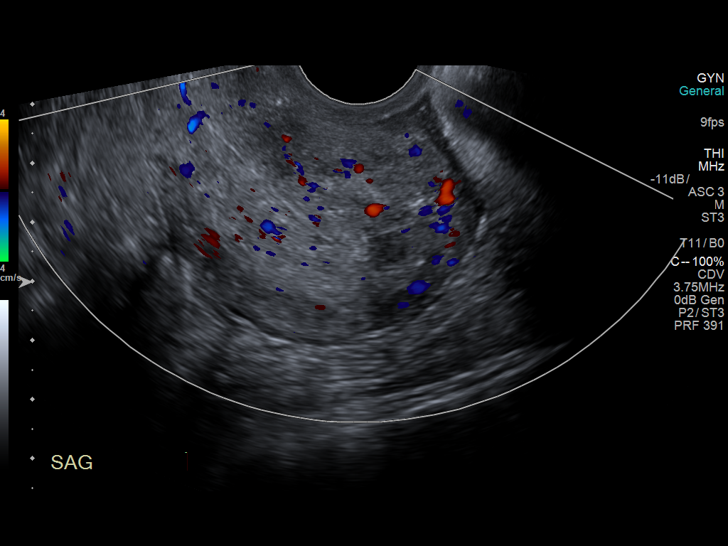

[14 of 25 positions shown; findings below may reference images not displayed]

FINDINGS: Uterus

Measurements: 6.9 x 4.5 x 5.8 cm. Probable 10 mm fibroid in the
fundus, submucosal.

Endometrium

Thickness: 8.2 mm.  No focal abnormality visualized.

Right ovary

Measurements: 3.3 x 1.8 x 1.4 cm. Normal appearance/no adnexal mass.

Left ovary

Measurements: 3.3 x 1.9 x 1.8 cm. Normal appearance/no adnexal mass.

Other findings

No abnormal free fluid.
IMPRESSION: 10 mm submucosal fundal fibroid. Otherwise unremarkable uterus and
ovaries. No abnormal pelvic fluid collections.

## 2020-09-04 ENCOUNTER — Emergency Department (HOSPITAL_BASED_OUTPATIENT_CLINIC_OR_DEPARTMENT_OTHER)
Admission: EM | Admit: 2020-09-04 | Discharge: 2020-09-04 | Disposition: A | Discharge status: Home / Self Care | Payer: 59 | Attending: Emergency Medicine | Admitting: Emergency Medicine

## 2020-09-04 ENCOUNTER — Emergency Department (HOSPITAL_BASED_OUTPATIENT_CLINIC_OR_DEPARTMENT_OTHER): Payer: 59

## 2020-09-04 ENCOUNTER — Other Ambulatory Visit: Payer: Self-pay

## 2020-09-04 ENCOUNTER — Encounter (HOSPITAL_BASED_OUTPATIENT_CLINIC_OR_DEPARTMENT_OTHER): Payer: Self-pay | Admitting: Emergency Medicine

## 2020-09-04 DIAGNOSIS — S39012A Strain of muscle, fascia and tendon of lower back, initial encounter: Secondary | ICD-10-CM | POA: Diagnosis not present

## 2020-09-04 DIAGNOSIS — S161XXA Strain of muscle, fascia and tendon at neck level, initial encounter: Secondary | ICD-10-CM | POA: Insufficient documentation

## 2020-09-04 DIAGNOSIS — S3992XA Unspecified injury of lower back, initial encounter: Secondary | ICD-10-CM | POA: Diagnosis present

## 2020-09-04 DIAGNOSIS — S29019A Strain of muscle and tendon of unspecified wall of thorax, initial encounter: Secondary | ICD-10-CM | POA: Insufficient documentation

## 2020-09-04 HISTORY — DX: Essential (primary) hypertension: I10

## 2020-09-04 LAB — PREGNANCY, URINE: Preg Test, Ur: NEGATIVE

## 2020-09-04 MED ORDER — CYCLOBENZAPRINE HCL 10 MG PO TABS: 10.0000 mg | ORAL_TABLET | Freq: Three times a day (TID) | ORAL | 0 refills | Status: AC | PRN

## 2020-09-04 MED ORDER — NAPROXEN 250 MG PO TABS
500.0000 mg | ORAL_TABLET | Freq: Once | ORAL | Status: AC
  Administered 2020-09-04: 04:00:00 500 mg via ORAL
  Filled 2020-09-04: qty 2

## 2020-09-04 MED ORDER — NAPROXEN 375 MG PO TABS: ORAL_TABLET | ORAL | 0 refills | Status: AC

## 2020-09-04 MED ORDER — CYCLOBENZAPRINE HCL 10 MG PO TABS
10.0000 mg | ORAL_TABLET | Freq: Once | ORAL | Status: AC
  Administered 2020-09-04: 04:00:00 10 mg via ORAL
  Filled 2020-09-04: qty 1
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
